# Patient Record
Sex: Male | Born: 1990 | Race: Black or African American | Hispanic: No | Marital: Single | Smoking: Current every day smoker
Health system: Southern US, Community
[De-identification: ages and names within clinical notes are randomized; demographics above are authoritative.]

---

## 2020-05-09 ENCOUNTER — Emergency Department (HOSPITAL_BASED_OUTPATIENT_CLINIC_OR_DEPARTMENT_OTHER): Payer: Self-pay

## 2020-05-09 ENCOUNTER — Encounter (HOSPITAL_BASED_OUTPATIENT_CLINIC_OR_DEPARTMENT_OTHER): Payer: Self-pay | Admitting: *Deleted

## 2020-05-09 ENCOUNTER — Other Ambulatory Visit: Payer: Self-pay

## 2020-05-09 ENCOUNTER — Emergency Department (HOSPITAL_BASED_OUTPATIENT_CLINIC_OR_DEPARTMENT_OTHER)
Admission: EM | Admit: 2020-05-09 | Discharge: 2020-05-09 | Disposition: A | Discharge status: Home / Self Care | Payer: Self-pay | Attending: Emergency Medicine | Admitting: Emergency Medicine

## 2020-05-09 DIAGNOSIS — F17200 Nicotine dependence, unspecified, uncomplicated: Secondary | ICD-10-CM | POA: Insufficient documentation

## 2020-05-09 DIAGNOSIS — R0789 Other chest pain: Secondary | ICD-10-CM | POA: Insufficient documentation

## 2020-05-09 DIAGNOSIS — Z20822 Contact with and (suspected) exposure to covid-19: Secondary | ICD-10-CM | POA: Insufficient documentation

## 2020-05-09 LAB — CBC
HCT: 46.6 % (ref 39.0–52.0)
Hemoglobin: 16.7 g/dL (ref 13.0–17.0)
MCH: 28.5 pg (ref 26.0–34.0)
MCHC: 35.8 g/dL (ref 30.0–36.0)
MCV: 79.5 fL — ABNORMAL LOW (ref 80.0–100.0)
Platelets: 222 10*3/uL (ref 150–400)
RBC: 5.86 MIL/uL — ABNORMAL HIGH (ref 4.22–5.81)
RDW: 13.1 % (ref 11.5–15.5)
WBC: 6.1 10*3/uL (ref 4.0–10.5)
nRBC: 0 % (ref 0.0–0.2)

## 2020-05-09 LAB — BASIC METABOLIC PANEL
Anion gap: 11 (ref 5–15)
BUN: 14 mg/dL (ref 6–20)
CO2: 19 mmol/L — ABNORMAL LOW (ref 22–32)
Calcium: 9.1 mg/dL (ref 8.9–10.3)
Chloride: 103 mmol/L (ref 98–111)
Creatinine, Ser: 0.89 mg/dL (ref 0.61–1.24)
GFR, Estimated: >60 mL/min (ref >60)
Glucose, Bld: 150 mg/dL — ABNORMAL HIGH (ref 70–99)
Potassium: 3.5 mmol/L (ref 3.5–5.1)
Sodium: 133 mmol/L — ABNORMAL LOW (ref 135–145)

## 2020-05-09 LAB — TROPONIN I (HIGH SENSITIVITY): Troponin I (High Sensitivity): 5 ng/L (ref <18)

## 2020-05-09 LAB — D-DIMER, QUANTITATIVE: D-Dimer, Quant: 0.3 ug/mL-FEU (ref 0.00–0.50)

## 2020-05-09 MED ORDER — IBUPROFEN 800 MG PO TABS: 800.0000 mg | ORAL_TABLET | Freq: Three times a day (TID) | ORAL | 0 refills | Status: AC

## 2020-05-09 MED ORDER — CYCLOBENZAPRINE HCL 5 MG PO TABS: 5.0000 mg | ORAL_TABLET | Freq: Three times a day (TID) | ORAL | 0 refills | Status: AC | PRN

## 2020-05-09 MED ORDER — SODIUM CHLORIDE 0.9 % IV BOLUS
1000.0000 mL | Freq: Once | INTRAVENOUS | Status: AC
  Administered 2020-05-09: 20:00:00 1000 mL via INTRAVENOUS

## 2020-05-09 MED ORDER — KETOROLAC TROMETHAMINE 30 MG/ML IJ SOLN
30.0000 mg | Freq: Once | INTRAMUSCULAR | Status: AC
  Administered 2020-05-09: 20:00:00 30 mg via INTRAVENOUS
  Filled 2020-05-09: qty 1

## 2020-05-09 NOTE — ED Triage Notes (Signed)
Chest pain x 2 weeks. Sob x 3 days. His hands feel numb at times. Oxygen sats 100%.

## 2020-05-09 NOTE — Discharge Instructions (Addendum)
Take motrin for pain and flexeril for muscle spasms. You likely have chest wall muscle strain   See your doctor   Your COVID test is pending so please stay home until it comes back. If you test positive for COVID you will need to stay home for 10 days   Return to ER if you have worse chest pain, shortness of breath, fever

## 2020-05-09 NOTE — ED Provider Notes (Signed)
[facility] EMERGENCY DEPARTMENT Provider Note   CSN: [id number] Arrival date & time: 05/09/20  1417     History Chief Complaint  Patient presents with  . Shortness of Breath  . Chest Pain    Phillip Simon is a 29 y.o. male here with chest pain, SOB, left hand numbness.  Patient has been having left-sided chest pain for the last several weeks. Patient states that he has shortness of breath as well and some associated L hand numbness. Patient denies any fever or chills or cough. Not vaccinated against covid. Patient has no cardiac history previously or history of blood clots   The history is provided by the patient.       History reviewed. No pertinent past medical history.  There are no problems to display for this patient.   History reviewed. No pertinent surgical history.     No family history on file.  Social History   Tobacco Use  . Smoking status: Current Every Day Smoker  . Smokeless tobacco: Never Used  Substance Use Topics  . Alcohol use: Not Currently  . Drug use: Never    Home Medications Prior to Admission medications   Not on File    Allergies    Patient has no known allergies.  Review of Systems   Review of Systems  Respiratory: Positive for shortness of breath.   Cardiovascular: Positive for chest pain.  All other systems reviewed and are negative.   Physical Exam Updated Vital Signs BP 123/88 (BP Location: Left Arm)   Pulse 89   Temp 98.5 F (36.9 C) (Oral)   Resp 20   Ht 5\' 9"  (1.753 m)   Wt 108.9 kg   SpO2 98%   BMI 35.44 kg/m   Physical Exam Vitals and nursing note reviewed.  Constitutional:      Appearance: He is well-developed.     Comments: Slightly anxious   HENT:     Head: Normocephalic.     Mouth/Throat:     Mouth: Mucous membranes are moist.  Eyes:     Extraocular Movements: Extraocular movements intact.     Pupils: Pupils are equal, round, and reactive to light.  Cardiovascular:     Rate and  Rhythm: Normal rate and regular rhythm.  Pulmonary:     Effort: Pulmonary effort is normal.     Breath sounds: Normal breath sounds.  Abdominal:     General: Bowel sounds are normal.     Palpations: Abdomen is soft.  Musculoskeletal:        General: Normal range of motion.     Cervical back: Normal range of motion and neck supple.  Skin:    General: Skin is warm.     Capillary Refill: Capillary refill takes less than 2 seconds.  Neurological:     General: No focal deficit present.     Mental Status: He is alert and oriented to person, place, and time.  Psychiatric:        Mood and Affect: Mood normal.        Behavior: Behavior normal.     ED Results / Procedures / Treatments   Labs (all labs ordered are listed, but only abnormal results are displayed) Labs Reviewed  BASIC METABOLIC PANEL - Abnormal; Notable for the following components:      Result Value   Sodium 133 (*)    CO2 19 (*)    Glucose, Bld 150 (*)    All other components within normal limits  CBC - Abnormal; Notable for the following components:   RBC 5.86 (*)    MCV 79.5 (*)    All other components within normal limits  SARS CORONAVIRUS 2 (TAT 6-24 HRS)  D-DIMER, QUANTITATIVE (NOT AT [facility])  TROPONIN I (HIGH SENSITIVITY)  TROPONIN I (HIGH SENSITIVITY)    EKG EKG Interpretation  Date/Time:  Tuesday May 09 2020 14:22:54 EST Ventricular Rate:  100 PR Interval:  130 QRS Duration: 82 QT Interval:  352 QTC Calculation: 454 R Axis:   87 Text Interpretation: Normal sinus rhythm Nonspecific ST and T wave abnormality Abnormal ECG No previous ECGs available Confirmed by Richardean Canal [phone]) on 05/09/2020 6:56:36 PM   Radiology DG Chest 2 View  Result Date: 05/09/2020 CLINICAL DATA:  Chest pain and shortness of breath. EXAM: CHEST - 2 VIEW COMPARISON:  None. FINDINGS: The heart size is normal, exaggerated by low lung volumes. Mild central airway thickening is present. Patchy perihilar opacities are noted  as well. Lungs are otherwise clear. Axial skeleton is within limits. IMPRESSION: Mild central airway thickening and patchy perihilar opacities. This is nonspecific, but likely represents reactive airways disease or viral process. Electronically Signed   By: Marin Roberts M.D.   On: 05/09/2020 14:53    Procedures Procedures (including critical care time)  Medications Ordered in ED Medications  sodium chloride 0.9 % bolus 1,000 mL (1,000 mLs Intravenous New Bag/Given 05/09/20 1941)  ketorolac (TORADOL) 30 MG/ML injection 30 mg (30 mg Intravenous Given 05/09/20 1937)    ED Course  I have reviewed the triage vital signs and the nursing notes.  Pertinent labs & imaging results that were available during my care of the patient were reviewed by me and considered in my medical decision making (see chart for details).    MDM Rules/Calculators/A&P                          Phillip Simon is a 29 y.o. male here with chest pain, SOB. Likely MSK pain. Consider PE vs ACS vs COVID. Will get labs, trop x 1 , d-dimer, CXR, COVID test.   8:25 PM Troponin is normal and D-dimer is normal.  Labs unremarkable.  I think likely chest wall pain.  Will discharge home with Motrin and Flexeril.  Of note, his chest x-ray showed possible viral process.  Covid test is sent and I told him he needs to stay home until results come back.  If it is positive you will need to quarantine for 10 days.  Final Clinical Impression(s) / ED Diagnoses Final diagnoses:  None    Rx / DC Orders ED Discharge Orders    None       Charlynne Pander, MD 05/09/20 2026

## 2020-05-10 LAB — SARS CORONAVIRUS 2 (TAT 6-24 HRS): SARS Coronavirus 2: NEGATIVE

## 2021-06-07 ENCOUNTER — Other Ambulatory Visit: Payer: Self-pay | Admitting: Otolaryngology

## 2021-07-16 ENCOUNTER — Ambulatory Visit (HOSPITAL_BASED_OUTPATIENT_CLINIC_OR_DEPARTMENT_OTHER): Admit: 2021-07-16 | Payer: Self-pay | Admitting: Otolaryngology

## 2021-07-16 ENCOUNTER — Encounter (HOSPITAL_BASED_OUTPATIENT_CLINIC_OR_DEPARTMENT_OTHER): Payer: Self-pay

## 2021-07-16 SURGERY — TONSILLECTOMY AND ADENOIDECTOMY
Anesthesia: General | Laterality: Bilateral

## 2021-08-05 IMAGING — DX DG CHEST 2V
3 series · 3 of 3 positions shown · non-contrast
Comparison: None.

CLINICAL DATA: Chest pain and shortness of breath.

EXAM:
CHEST - 2 VIEW

[chest lat (1 of 2)]
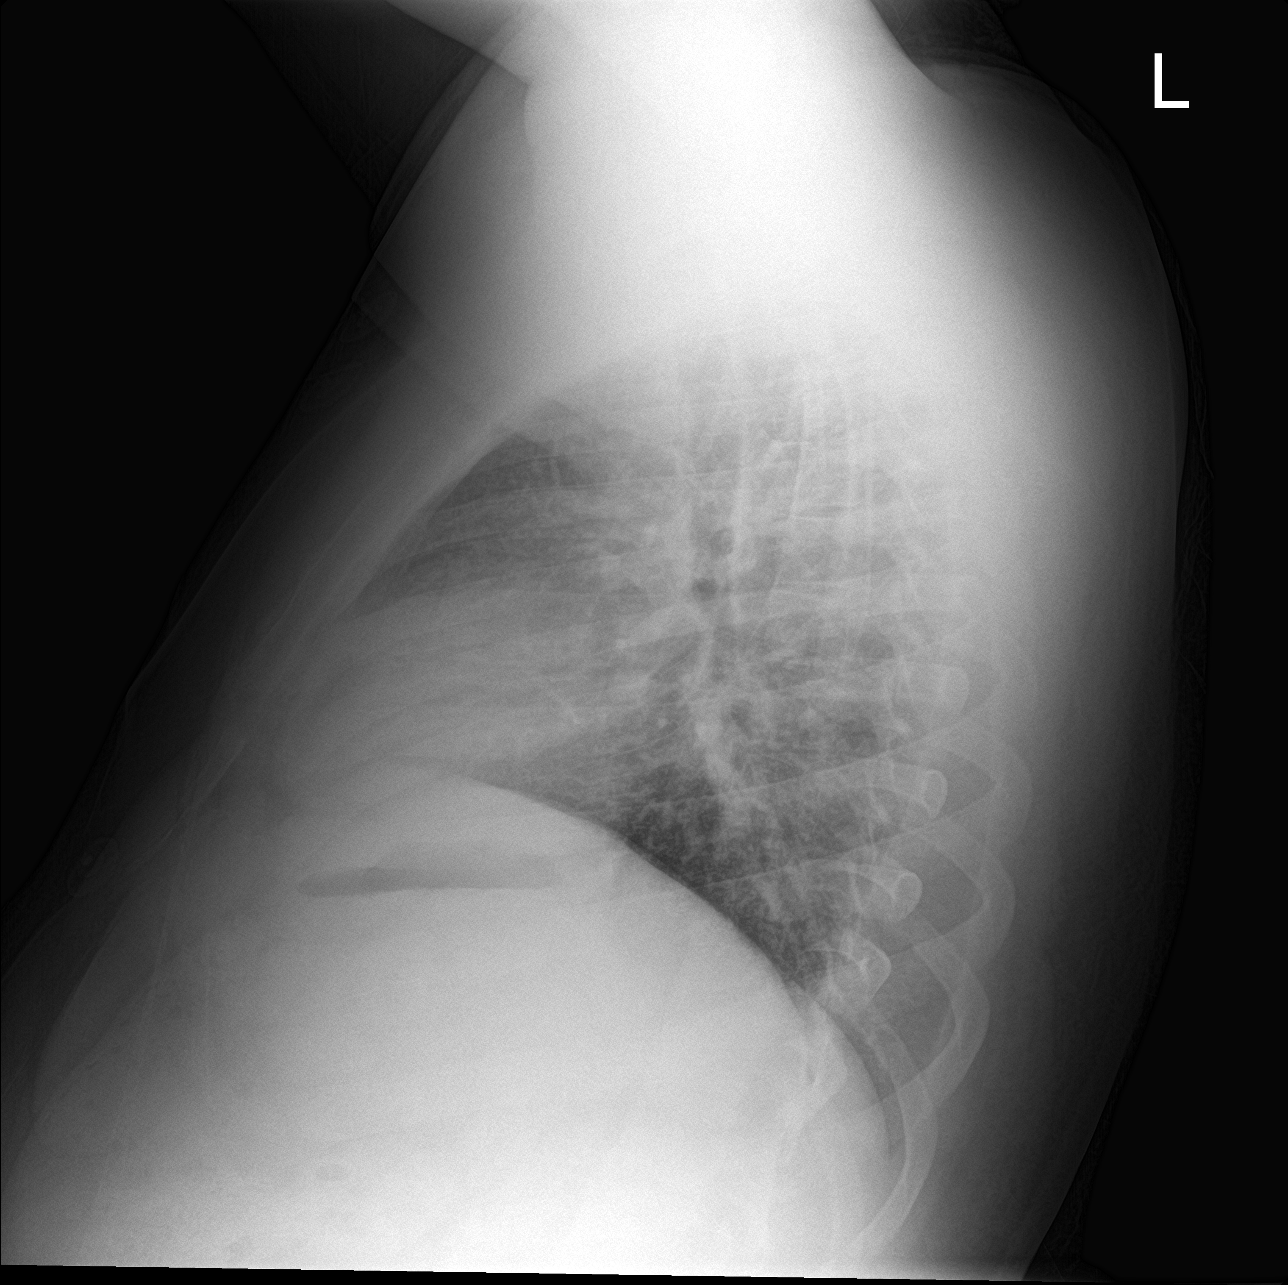

[chest pa]
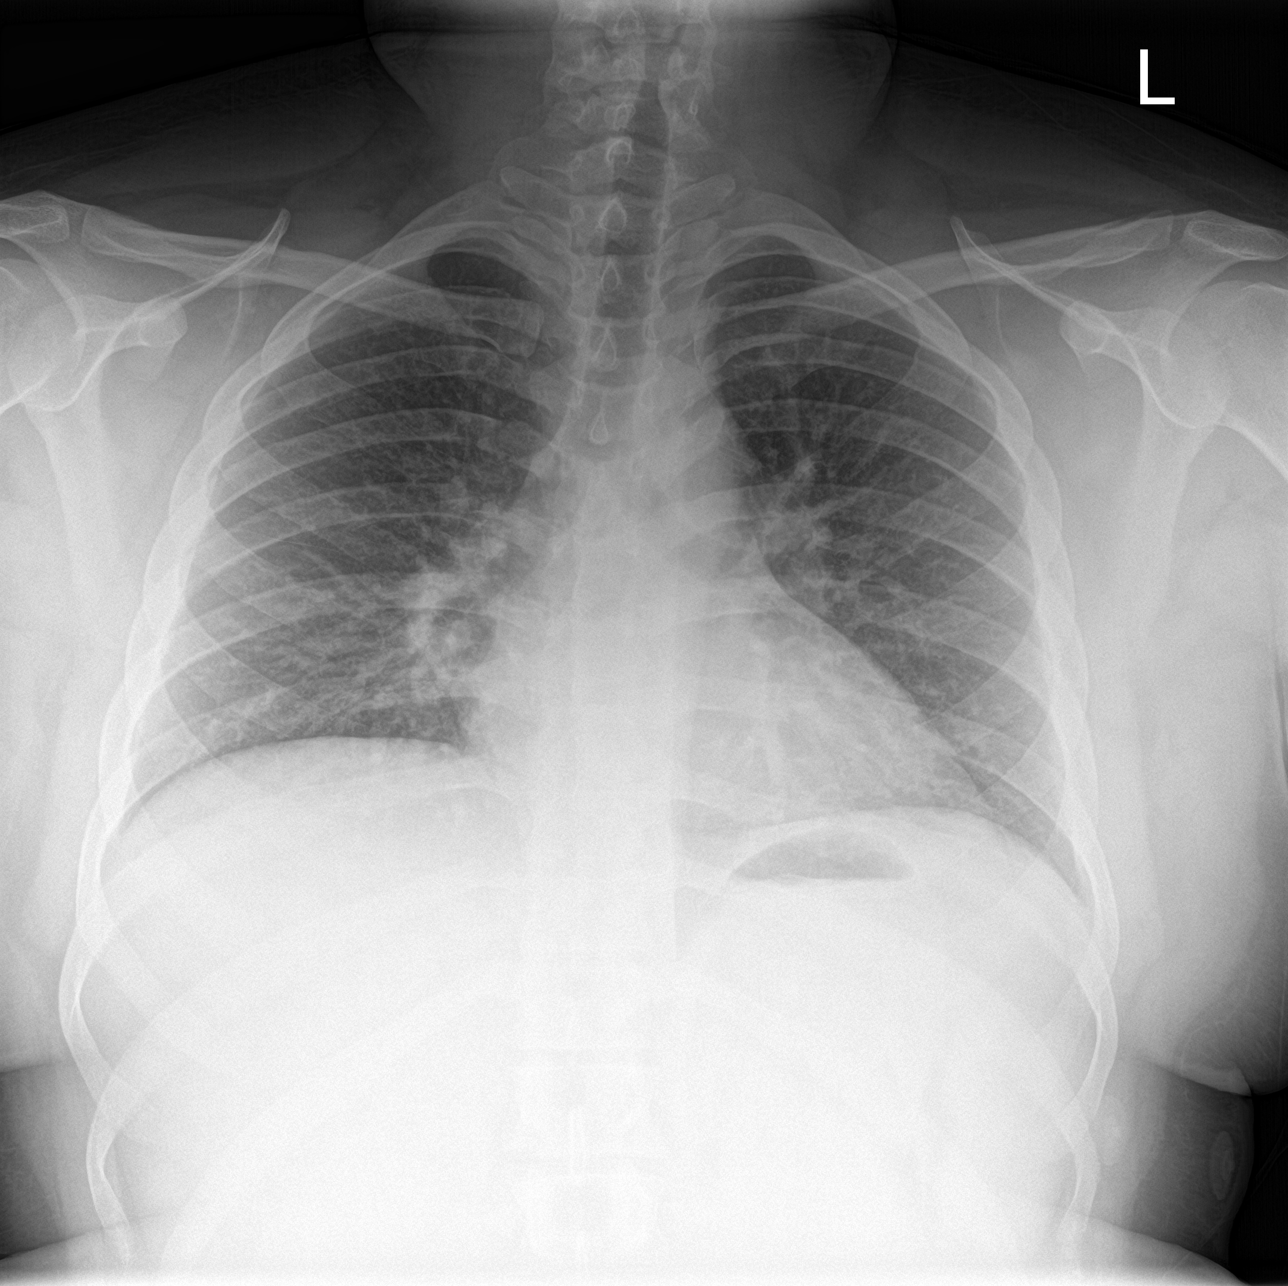

[chest lat (2 of 2)]
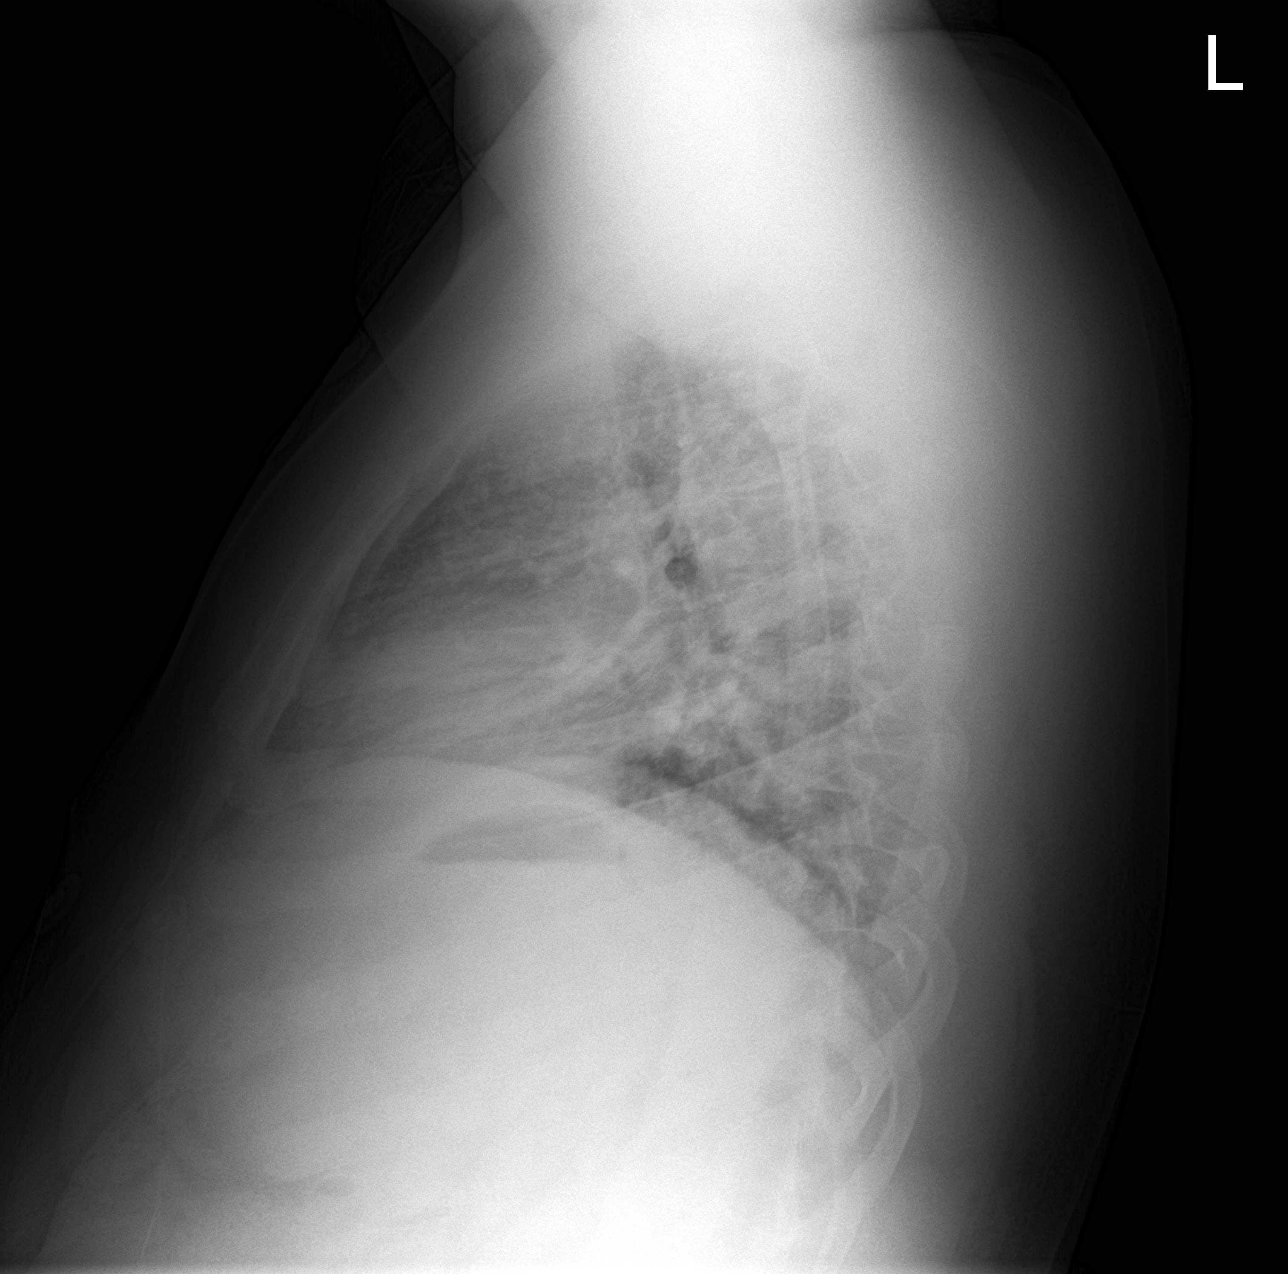

[3 of 3 positions shown; findings below may reference images not displayed]

FINDINGS: The heart size is normal, exaggerated by low lung volumes. Mild
central airway thickening is present. Patchy perihilar opacities are
noted as well. Lungs are otherwise clear. Axial skeleton is within
limits.
IMPRESSION: Mild central airway thickening and patchy perihilar opacities. This
is nonspecific, but likely represents reactive airways disease or
viral process.
